# Patient Record
Sex: Male | Born: 1965 | Race: White | Hispanic: No | Marital: Single | Smoking: Never smoker
Health system: Southern US, Community
[De-identification: ages and names within clinical notes are randomized; demographics above are authoritative.]

## PROBLEM LIST (undated history)

## (undated) DIAGNOSIS — E119 Type 2 diabetes mellitus without complications: Secondary | ICD-10-CM

## (undated) DIAGNOSIS — I10 Essential (primary) hypertension: Secondary | ICD-10-CM

---

## 2015-09-18 DIAGNOSIS — E119 Type 2 diabetes mellitus without complications: Secondary | ICD-10-CM | POA: Diagnosis not present

## 2015-09-18 DIAGNOSIS — Z79899 Other long term (current) drug therapy: Secondary | ICD-10-CM | POA: Diagnosis not present

## 2015-09-18 DIAGNOSIS — I10 Essential (primary) hypertension: Secondary | ICD-10-CM | POA: Diagnosis not present

## 2015-09-25 DIAGNOSIS — E119 Type 2 diabetes mellitus without complications: Secondary | ICD-10-CM | POA: Diagnosis not present

## 2015-09-25 DIAGNOSIS — I10 Essential (primary) hypertension: Secondary | ICD-10-CM | POA: Diagnosis not present

## 2015-10-12 DIAGNOSIS — S7011XA Contusion of right thigh, initial encounter: Secondary | ICD-10-CM | POA: Diagnosis not present

## 2016-03-13 DIAGNOSIS — H52222 Regular astigmatism, left eye: Secondary | ICD-10-CM | POA: Diagnosis not present

## 2016-03-13 DIAGNOSIS — H5203 Hypermetropia, bilateral: Secondary | ICD-10-CM | POA: Diagnosis not present

## 2016-03-13 DIAGNOSIS — H524 Presbyopia: Secondary | ICD-10-CM | POA: Diagnosis not present

## 2016-04-03 DIAGNOSIS — E119 Type 2 diabetes mellitus without complications: Secondary | ICD-10-CM | POA: Diagnosis not present

## 2016-04-26 DIAGNOSIS — E1139 Type 2 diabetes mellitus with other diabetic ophthalmic complication: Secondary | ICD-10-CM | POA: Diagnosis not present

## 2016-04-26 DIAGNOSIS — Z6839 Body mass index (BMI) 39.0-39.9, adult: Secondary | ICD-10-CM | POA: Diagnosis not present

## 2016-04-26 DIAGNOSIS — I10 Essential (primary) hypertension: Secondary | ICD-10-CM | POA: Diagnosis not present

## 2016-08-20 DIAGNOSIS — Z79899 Other long term (current) drug therapy: Secondary | ICD-10-CM | POA: Diagnosis not present

## 2016-08-20 DIAGNOSIS — E119 Type 2 diabetes mellitus without complications: Secondary | ICD-10-CM | POA: Diagnosis not present

## 2016-08-27 DIAGNOSIS — E1139 Type 2 diabetes mellitus with other diabetic ophthalmic complication: Secondary | ICD-10-CM | POA: Diagnosis not present

## 2016-08-27 DIAGNOSIS — I10 Essential (primary) hypertension: Secondary | ICD-10-CM | POA: Diagnosis not present

## 2016-10-18 ENCOUNTER — Encounter (HOSPITAL_COMMUNITY): Payer: Self-pay | Admitting: *Deleted

## 2016-10-18 ENCOUNTER — Emergency Department (HOSPITAL_COMMUNITY): Payer: BLUE CROSS/BLUE SHIELD

## 2016-10-18 ENCOUNTER — Emergency Department (HOSPITAL_COMMUNITY)
Admission: EM | Admit: 2016-10-18 | Discharge: 2016-10-18 | Disposition: A | Discharge status: Home / Self Care | Payer: BLUE CROSS/BLUE SHIELD | Attending: Emergency Medicine | Admitting: Emergency Medicine

## 2016-10-18 DIAGNOSIS — Z79899 Other long term (current) drug therapy: Secondary | ICD-10-CM | POA: Diagnosis not present

## 2016-10-18 DIAGNOSIS — Z7982 Long term (current) use of aspirin: Secondary | ICD-10-CM | POA: Insufficient documentation

## 2016-10-18 DIAGNOSIS — Y990 Civilian activity done for income or pay: Secondary | ICD-10-CM | POA: Diagnosis not present

## 2016-10-18 DIAGNOSIS — E119 Type 2 diabetes mellitus without complications: Secondary | ICD-10-CM | POA: Diagnosis not present

## 2016-10-18 DIAGNOSIS — S0993XA Unspecified injury of face, initial encounter: Secondary | ICD-10-CM | POA: Diagnosis not present

## 2016-10-18 DIAGNOSIS — Y9289 Other specified places as the place of occurrence of the external cause: Secondary | ICD-10-CM | POA: Insufficient documentation

## 2016-10-18 DIAGNOSIS — I10 Essential (primary) hypertension: Secondary | ICD-10-CM | POA: Insufficient documentation

## 2016-10-18 DIAGNOSIS — S0592XA Unspecified injury of left eye and orbit, initial encounter: Secondary | ICD-10-CM | POA: Diagnosis present

## 2016-10-18 DIAGNOSIS — H578 Other specified disorders of eye and adnexa: Secondary | ICD-10-CM | POA: Diagnosis not present

## 2016-10-18 DIAGNOSIS — S0522XA Ocular laceration and rupture with prolapse or loss of intraocular tissue, left eye, initial encounter: Secondary | ICD-10-CM | POA: Diagnosis not present

## 2016-10-18 DIAGNOSIS — H538 Other visual disturbances: Secondary | ICD-10-CM | POA: Insufficient documentation

## 2016-10-18 DIAGNOSIS — Y939 Activity, unspecified: Secondary | ICD-10-CM | POA: Insufficient documentation

## 2016-10-18 DIAGNOSIS — S0990XA Unspecified injury of head, initial encounter: Secondary | ICD-10-CM | POA: Diagnosis not present

## 2016-10-18 DIAGNOSIS — T7411XA Adult physical abuse, confirmed, initial encounter: Secondary | ICD-10-CM | POA: Diagnosis not present

## 2016-10-18 DIAGNOSIS — S0532XA Ocular laceration without prolapse or loss of intraocular tissue, left eye, initial encounter: Secondary | ICD-10-CM

## 2016-10-18 DIAGNOSIS — H1132 Conjunctival hemorrhage, left eye: Secondary | ICD-10-CM

## 2016-10-18 HISTORY — DX: Type 2 diabetes mellitus without complications: E11.9

## 2016-10-18 HISTORY — DX: Essential (primary) hypertension: I10

## 2016-10-18 MED ORDER — FLUORESCEIN SODIUM 0.6 MG OP STRP
1.0000 | ORAL_STRIP | Freq: Once | OPHTHALMIC | Status: AC
  Administered 2016-10-18: 1 via OPHTHALMIC
  Filled 2016-10-18: qty 1

## 2016-10-18 MED ORDER — TETRACAINE HCL 0.5 % OP SOLN
1.0000 [drp] | Freq: Once | OPHTHALMIC | Status: AC
  Administered 2016-10-18: 1 [drp] via OPHTHALMIC
  Filled 2016-10-18: qty 4

## 2016-10-18 MED ORDER — ERYTHROMYCIN 5 MG/GM OP OINT
TOPICAL_OINTMENT | Freq: Once | OPHTHALMIC | Status: AC
  Administered 2016-10-18: 20:00:00 via OPHTHALMIC
  Filled 2016-10-18: qty 3.5
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
## 2016-10-18 NOTE — Discharge Instructions (Signed)
Avoid rubbing your eye. You may continue using cool compresses or an ice pack to help minimize swelling.  Apply a small ribbon of the antbiotic ointment in your left eye three times daily for the next week.  Call Dr. Charise Killianotter for a recheck of your injury early next week.

## 2016-10-21 DIAGNOSIS — S0532XA Ocular laceration without prolapse or loss of intraocular tissue, left eye, initial encounter: Secondary | ICD-10-CM | POA: Diagnosis not present

## 2016-10-21 DIAGNOSIS — H1132 Conjunctival hemorrhage, left eye: Secondary | ICD-10-CM | POA: Diagnosis not present

## 2016-10-25 DIAGNOSIS — S0532XD Ocular laceration without prolapse or loss of intraocular tissue, left eye, subsequent encounter: Secondary | ICD-10-CM | POA: Diagnosis not present

## 2016-10-25 DIAGNOSIS — H1132 Conjunctival hemorrhage, left eye: Secondary | ICD-10-CM | POA: Diagnosis not present

## 2016-11-01 DIAGNOSIS — H1132 Conjunctival hemorrhage, left eye: Secondary | ICD-10-CM | POA: Diagnosis not present

## 2016-11-01 DIAGNOSIS — S0532XD Ocular laceration without prolapse or loss of intraocular tissue, left eye, subsequent encounter: Secondary | ICD-10-CM | POA: Diagnosis not present

## 2016-11-27 DIAGNOSIS — Z79899 Other long term (current) drug therapy: Secondary | ICD-10-CM | POA: Diagnosis not present

## 2016-11-27 DIAGNOSIS — I10 Essential (primary) hypertension: Secondary | ICD-10-CM | POA: Diagnosis not present

## 2016-11-27 DIAGNOSIS — E11319 Type 2 diabetes mellitus with unspecified diabetic retinopathy without macular edema: Secondary | ICD-10-CM | POA: Diagnosis not present

## 2016-12-04 DIAGNOSIS — E785 Hyperlipidemia, unspecified: Secondary | ICD-10-CM | POA: Diagnosis not present

## 2016-12-04 DIAGNOSIS — I10 Essential (primary) hypertension: Secondary | ICD-10-CM | POA: Diagnosis not present

## 2016-12-04 DIAGNOSIS — E119 Type 2 diabetes mellitus without complications: Secondary | ICD-10-CM | POA: Diagnosis not present

## 2016-12-04 DIAGNOSIS — Z23 Encounter for immunization: Secondary | ICD-10-CM | POA: Diagnosis not present

## 2017-01-28 DIAGNOSIS — H53041 Amblyopia suspect, right eye: Secondary | ICD-10-CM | POA: Diagnosis not present

## 2017-01-28 DIAGNOSIS — E119 Type 2 diabetes mellitus without complications: Secondary | ICD-10-CM | POA: Diagnosis not present

## 2017-05-06 DIAGNOSIS — E11319 Type 2 diabetes mellitus with unspecified diabetic retinopathy without macular edema: Secondary | ICD-10-CM | POA: Diagnosis not present

## 2017-05-13 DIAGNOSIS — Z6839 Body mass index (BMI) 39.0-39.9, adult: Secondary | ICD-10-CM | POA: Diagnosis not present

## 2017-05-13 DIAGNOSIS — E1129 Type 2 diabetes mellitus with other diabetic kidney complication: Secondary | ICD-10-CM | POA: Diagnosis not present

## 2017-09-04 DIAGNOSIS — E1129 Type 2 diabetes mellitus with other diabetic kidney complication: Secondary | ICD-10-CM | POA: Diagnosis not present

## 2018-04-06 DIAGNOSIS — Z79899 Other long term (current) drug therapy: Secondary | ICD-10-CM | POA: Diagnosis not present

## 2018-04-06 DIAGNOSIS — I10 Essential (primary) hypertension: Secondary | ICD-10-CM | POA: Diagnosis not present

## 2018-04-06 DIAGNOSIS — E785 Hyperlipidemia, unspecified: Secondary | ICD-10-CM | POA: Diagnosis not present

## 2018-04-06 DIAGNOSIS — E1129 Type 2 diabetes mellitus with other diabetic kidney complication: Secondary | ICD-10-CM | POA: Diagnosis not present

## 2018-04-13 DIAGNOSIS — E1169 Type 2 diabetes mellitus with other specified complication: Secondary | ICD-10-CM | POA: Diagnosis not present

## 2018-04-13 DIAGNOSIS — E785 Hyperlipidemia, unspecified: Secondary | ICD-10-CM | POA: Diagnosis not present

## 2018-04-13 DIAGNOSIS — Z23 Encounter for immunization: Secondary | ICD-10-CM | POA: Diagnosis not present

## 2018-04-13 DIAGNOSIS — I10 Essential (primary) hypertension: Secondary | ICD-10-CM | POA: Diagnosis not present

## 2018-04-24 DIAGNOSIS — H53041 Amblyopia suspect, right eye: Secondary | ICD-10-CM | POA: Diagnosis not present

## 2018-04-24 DIAGNOSIS — E119 Type 2 diabetes mellitus without complications: Secondary | ICD-10-CM | POA: Diagnosis not present

## 2018-04-24 DIAGNOSIS — H2513 Age-related nuclear cataract, bilateral: Secondary | ICD-10-CM | POA: Diagnosis not present

## 2018-04-24 DIAGNOSIS — H40053 Ocular hypertension, bilateral: Secondary | ICD-10-CM | POA: Diagnosis not present
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
## 2018-08-05 DIAGNOSIS — Z79899 Other long term (current) drug therapy: Secondary | ICD-10-CM | POA: Diagnosis not present

## 2018-08-05 DIAGNOSIS — E785 Hyperlipidemia, unspecified: Secondary | ICD-10-CM | POA: Diagnosis not present

## 2018-08-05 DIAGNOSIS — E1159 Type 2 diabetes mellitus with other circulatory complications: Secondary | ICD-10-CM | POA: Diagnosis not present

## 2018-08-05 DIAGNOSIS — I10 Essential (primary) hypertension: Secondary | ICD-10-CM | POA: Diagnosis not present

## 2018-08-12 DIAGNOSIS — E1159 Type 2 diabetes mellitus with other circulatory complications: Secondary | ICD-10-CM | POA: Diagnosis not present

## 2018-12-09 DIAGNOSIS — E1159 Type 2 diabetes mellitus with other circulatory complications: Secondary | ICD-10-CM | POA: Diagnosis not present

## 2018-12-09 DIAGNOSIS — I10 Essential (primary) hypertension: Secondary | ICD-10-CM | POA: Diagnosis not present

## 2018-12-09 DIAGNOSIS — Z79899 Other long term (current) drug therapy: Secondary | ICD-10-CM | POA: Diagnosis not present

## 2018-12-16 DIAGNOSIS — I10 Essential (primary) hypertension: Secondary | ICD-10-CM | POA: Diagnosis not present

## 2018-12-16 DIAGNOSIS — E1129 Type 2 diabetes mellitus with other diabetic kidney complication: Secondary | ICD-10-CM | POA: Diagnosis not present

## 2019-04-12 DIAGNOSIS — Z79899 Other long term (current) drug therapy: Secondary | ICD-10-CM | POA: Diagnosis not present

## 2019-04-12 DIAGNOSIS — I10 Essential (primary) hypertension: Secondary | ICD-10-CM | POA: Diagnosis not present

## 2019-04-12 DIAGNOSIS — E1129 Type 2 diabetes mellitus with other diabetic kidney complication: Secondary | ICD-10-CM | POA: Diagnosis not present

## 2019-04-19 DIAGNOSIS — E1122 Type 2 diabetes mellitus with diabetic chronic kidney disease: Secondary | ICD-10-CM | POA: Diagnosis not present

## 2019-04-19 DIAGNOSIS — U071 COVID-19: Secondary | ICD-10-CM | POA: Diagnosis not present

## 2019-09-28 DIAGNOSIS — E1129 Type 2 diabetes mellitus with other diabetic kidney complication: Secondary | ICD-10-CM | POA: Diagnosis not present

## 2019-10-08 DIAGNOSIS — Z6841 Body Mass Index (BMI) 40.0 and over, adult: Secondary | ICD-10-CM | POA: Diagnosis not present

## 2019-10-08 DIAGNOSIS — I10 Essential (primary) hypertension: Secondary | ICD-10-CM | POA: Diagnosis not present

## 2019-10-08 DIAGNOSIS — E1129 Type 2 diabetes mellitus with other diabetic kidney complication: Secondary | ICD-10-CM | POA: Diagnosis not present

## 2020-02-08 DIAGNOSIS — E1129 Type 2 diabetes mellitus with other diabetic kidney complication: Secondary | ICD-10-CM | POA: Diagnosis not present

## 2020-02-15 DIAGNOSIS — Z23 Encounter for immunization: Secondary | ICD-10-CM | POA: Diagnosis not present

## 2020-02-15 DIAGNOSIS — E1122 Type 2 diabetes mellitus with diabetic chronic kidney disease: Secondary | ICD-10-CM | POA: Diagnosis not present

## 2020-02-15 DIAGNOSIS — I10 Essential (primary) hypertension: Secondary | ICD-10-CM | POA: Diagnosis not present

## 2020-02-15 DIAGNOSIS — R7309 Other abnormal glucose: Secondary | ICD-10-CM | POA: Diagnosis not present

## 2020-06-07 DIAGNOSIS — E11319 Type 2 diabetes mellitus with unspecified diabetic retinopathy without macular edema: Secondary | ICD-10-CM | POA: Diagnosis not present

## 2020-06-14 DIAGNOSIS — R7309 Other abnormal glucose: Secondary | ICD-10-CM | POA: Diagnosis not present

## 2020-06-14 DIAGNOSIS — I10 Essential (primary) hypertension: Secondary | ICD-10-CM | POA: Diagnosis not present

## 2020-06-14 DIAGNOSIS — E1122 Type 2 diabetes mellitus with diabetic chronic kidney disease: Secondary | ICD-10-CM | POA: Diagnosis not present

## 2020-09-09 DIAGNOSIS — H6690 Otitis media, unspecified, unspecified ear: Secondary | ICD-10-CM | POA: Diagnosis not present

## 2020-09-09 DIAGNOSIS — H6090 Unspecified otitis externa, unspecified ear: Secondary | ICD-10-CM | POA: Diagnosis not present

## 2020-09-13 DIAGNOSIS — H66003 Acute suppurative otitis media without spontaneous rupture of ear drum, bilateral: Secondary | ICD-10-CM | POA: Diagnosis not present

## 2020-09-13 DIAGNOSIS — E119 Type 2 diabetes mellitus without complications: Secondary | ICD-10-CM | POA: Diagnosis not present

## 2020-09-21 DIAGNOSIS — H7291 Unspecified perforation of tympanic membrane, right ear: Secondary | ICD-10-CM | POA: Diagnosis not present

## 2020-09-21 DIAGNOSIS — H908 Mixed conductive and sensorineural hearing loss, unspecified: Secondary | ICD-10-CM | POA: Diagnosis not present

## 2020-09-21 DIAGNOSIS — H6692 Otitis media, unspecified, left ear: Secondary | ICD-10-CM | POA: Diagnosis not present

## 2020-09-21 DIAGNOSIS — E119 Type 2 diabetes mellitus without complications: Secondary | ICD-10-CM | POA: Diagnosis not present

## 2020-10-09 DIAGNOSIS — E1129 Type 2 diabetes mellitus with other diabetic kidney complication: Secondary | ICD-10-CM | POA: Diagnosis not present

## 2020-10-09 DIAGNOSIS — I10 Essential (primary) hypertension: Secondary | ICD-10-CM | POA: Diagnosis not present

## 2020-10-09 DIAGNOSIS — Z125 Encounter for screening for malignant neoplasm of prostate: Secondary | ICD-10-CM | POA: Diagnosis not present

## 2020-10-09 DIAGNOSIS — Z79899 Other long term (current) drug therapy: Secondary | ICD-10-CM | POA: Diagnosis not present

## 2020-10-16 DIAGNOSIS — E1122 Type 2 diabetes mellitus with diabetic chronic kidney disease: Secondary | ICD-10-CM | POA: Diagnosis not present

## 2020-10-16 DIAGNOSIS — I10 Essential (primary) hypertension: Secondary | ICD-10-CM | POA: Diagnosis not present

## 2020-10-16 DIAGNOSIS — R7309 Other abnormal glucose: Secondary | ICD-10-CM | POA: Diagnosis not present

## 2020-10-16 DIAGNOSIS — M109 Gout, unspecified: Secondary | ICD-10-CM | POA: Diagnosis not present

## 2020-11-13 DIAGNOSIS — H903 Sensorineural hearing loss, bilateral: Secondary | ICD-10-CM | POA: Diagnosis not present

## 2020-11-13 DIAGNOSIS — H6993 Unspecified Eustachian tube disorder, bilateral: Secondary | ICD-10-CM | POA: Diagnosis not present

## 2021-01-12 DIAGNOSIS — H6993 Unspecified Eustachian tube disorder, bilateral: Secondary | ICD-10-CM | POA: Diagnosis not present

## 2021-01-12 DIAGNOSIS — H6692 Otitis media, unspecified, left ear: Secondary | ICD-10-CM | POA: Diagnosis not present

## 2021-02-14 DIAGNOSIS — Z79899 Other long term (current) drug therapy: Secondary | ICD-10-CM | POA: Diagnosis not present

## 2021-02-14 DIAGNOSIS — M109 Gout, unspecified: Secondary | ICD-10-CM | POA: Diagnosis not present

## 2021-02-14 DIAGNOSIS — E1129 Type 2 diabetes mellitus with other diabetic kidney complication: Secondary | ICD-10-CM | POA: Diagnosis not present

## 2021-02-21 DIAGNOSIS — I10 Essential (primary) hypertension: Secondary | ICD-10-CM | POA: Diagnosis not present

## 2021-02-21 DIAGNOSIS — M109 Gout, unspecified: Secondary | ICD-10-CM | POA: Diagnosis not present

## 2021-02-21 DIAGNOSIS — E1122 Type 2 diabetes mellitus with diabetic chronic kidney disease: Secondary | ICD-10-CM | POA: Diagnosis not present

## 2021-02-21 DIAGNOSIS — R7309 Other abnormal glucose: Secondary | ICD-10-CM | POA: Diagnosis not present

## 2021-02-21 DIAGNOSIS — Z23 Encounter for immunization: Secondary | ICD-10-CM | POA: Diagnosis not present

## 2021-06-13 DIAGNOSIS — E1159 Type 2 diabetes mellitus with other circulatory complications: Secondary | ICD-10-CM | POA: Diagnosis not present

## 2021-06-13 DIAGNOSIS — I10 Essential (primary) hypertension: Secondary | ICD-10-CM | POA: Diagnosis not present

## 2021-06-13 DIAGNOSIS — M109 Gout, unspecified: Secondary | ICD-10-CM | POA: Diagnosis not present

## 2021-06-13 DIAGNOSIS — Z79899 Other long term (current) drug therapy: Secondary | ICD-10-CM | POA: Diagnosis not present

## 2021-06-20 DIAGNOSIS — R7309 Other abnormal glucose: Secondary | ICD-10-CM | POA: Diagnosis not present

## 2021-06-20 DIAGNOSIS — E785 Hyperlipidemia, unspecified: Secondary | ICD-10-CM | POA: Diagnosis not present

## 2021-06-20 DIAGNOSIS — I10 Essential (primary) hypertension: Secondary | ICD-10-CM | POA: Diagnosis not present

## 2021-06-20 DIAGNOSIS — M109 Gout, unspecified: Secondary | ICD-10-CM | POA: Diagnosis not present

## 2021-10-12 DIAGNOSIS — M109 Gout, unspecified: Secondary | ICD-10-CM | POA: Diagnosis not present

## 2021-10-12 DIAGNOSIS — E118 Type 2 diabetes mellitus with unspecified complications: Secondary | ICD-10-CM | POA: Diagnosis not present

## 2021-10-12 DIAGNOSIS — Z79899 Other long term (current) drug therapy: Secondary | ICD-10-CM | POA: Diagnosis not present

## 2021-10-12 DIAGNOSIS — I10 Essential (primary) hypertension: Secondary | ICD-10-CM | POA: Diagnosis not present

## 2021-10-19 DIAGNOSIS — I10 Essential (primary) hypertension: Secondary | ICD-10-CM | POA: Diagnosis not present

## 2021-10-19 DIAGNOSIS — E1122 Type 2 diabetes mellitus with diabetic chronic kidney disease: Secondary | ICD-10-CM | POA: Diagnosis not present

## 2021-10-19 DIAGNOSIS — R7309 Other abnormal glucose: Secondary | ICD-10-CM | POA: Diagnosis not present

## 2021-10-23 DIAGNOSIS — N3000 Acute cystitis without hematuria: Secondary | ICD-10-CM | POA: Diagnosis not present

## 2021-10-29 ENCOUNTER — Other Ambulatory Visit: Payer: Self-pay | Admitting: Internal Medicine

## 2021-10-29 ENCOUNTER — Other Ambulatory Visit (HOSPITAL_COMMUNITY): Payer: Self-pay | Admitting: Internal Medicine

## 2021-10-29 DIAGNOSIS — N390 Urinary tract infection, site not specified: Secondary | ICD-10-CM

## 2021-11-06 ENCOUNTER — Ambulatory Visit (HOSPITAL_COMMUNITY)
Admission: RE | Admit: 2021-11-06 | Discharge: 2021-11-06 | Disposition: A | Discharge status: Home / Self Care | Payer: BC Managed Care – PPO | Source: Ambulatory Visit | Attending: Internal Medicine | Admitting: Internal Medicine

## 2021-11-06 DIAGNOSIS — N390 Urinary tract infection, site not specified: Secondary | ICD-10-CM | POA: Diagnosis not present

## 2022-01-22 DIAGNOSIS — Z79899 Other long term (current) drug therapy: Secondary | ICD-10-CM | POA: Diagnosis not present

## 2022-01-22 DIAGNOSIS — I10 Essential (primary) hypertension: Secondary | ICD-10-CM | POA: Diagnosis not present

## 2022-01-22 DIAGNOSIS — E1129 Type 2 diabetes mellitus with other diabetic kidney complication: Secondary | ICD-10-CM | POA: Diagnosis not present

## 2022-01-22 DIAGNOSIS — E785 Hyperlipidemia, unspecified: Secondary | ICD-10-CM | POA: Diagnosis not present

## 2022-01-29 DIAGNOSIS — Z23 Encounter for immunization: Secondary | ICD-10-CM | POA: Diagnosis not present

## 2022-01-29 DIAGNOSIS — R7309 Other abnormal glucose: Secondary | ICD-10-CM | POA: Diagnosis not present

## 2022-01-29 DIAGNOSIS — I10 Essential (primary) hypertension: Secondary | ICD-10-CM | POA: Diagnosis not present

## 2022-01-29 DIAGNOSIS — E1122 Type 2 diabetes mellitus with diabetic chronic kidney disease: Secondary | ICD-10-CM | POA: Diagnosis not present

## 2022-05-29 DIAGNOSIS — E1129 Type 2 diabetes mellitus with other diabetic kidney complication: Secondary | ICD-10-CM | POA: Diagnosis not present

## 2022-06-12 DIAGNOSIS — E1122 Type 2 diabetes mellitus with diabetic chronic kidney disease: Secondary | ICD-10-CM | POA: Diagnosis not present

## 2022-06-12 DIAGNOSIS — I10 Essential (primary) hypertension: Secondary | ICD-10-CM | POA: Diagnosis not present

## 2022-06-12 DIAGNOSIS — R7309 Other abnormal glucose: Secondary | ICD-10-CM | POA: Diagnosis not present

## 2022-09-02 DIAGNOSIS — I10 Essential (primary) hypertension: Secondary | ICD-10-CM | POA: Diagnosis not present

## 2022-09-02 DIAGNOSIS — Z79899 Other long term (current) drug therapy: Secondary | ICD-10-CM | POA: Diagnosis not present

## 2022-09-02 DIAGNOSIS — E1129 Type 2 diabetes mellitus with other diabetic kidney complication: Secondary | ICD-10-CM | POA: Diagnosis not present

## 2022-09-02 DIAGNOSIS — E785 Hyperlipidemia, unspecified: Secondary | ICD-10-CM | POA: Diagnosis not present

## 2022-09-02 DIAGNOSIS — M1000 Idiopathic gout, unspecified site: Secondary | ICD-10-CM | POA: Diagnosis not present

## 2022-09-02 DIAGNOSIS — Z125 Encounter for screening for malignant neoplasm of prostate: Secondary | ICD-10-CM | POA: Diagnosis not present

## 2022-09-09 DIAGNOSIS — E1122 Type 2 diabetes mellitus with diabetic chronic kidney disease: Secondary | ICD-10-CM | POA: Diagnosis not present

## 2022-09-09 DIAGNOSIS — I10 Essential (primary) hypertension: Secondary | ICD-10-CM | POA: Diagnosis not present

## 2022-09-09 DIAGNOSIS — E785 Hyperlipidemia, unspecified: Secondary | ICD-10-CM | POA: Diagnosis not present

## 2022-12-09 DIAGNOSIS — E1129 Type 2 diabetes mellitus with other diabetic kidney complication: Secondary | ICD-10-CM | POA: Diagnosis not present

## 2022-12-16 DIAGNOSIS — I10 Essential (primary) hypertension: Secondary | ICD-10-CM | POA: Diagnosis not present

## 2022-12-16 DIAGNOSIS — Z23 Encounter for immunization: Secondary | ICD-10-CM | POA: Diagnosis not present

## 2022-12-16 DIAGNOSIS — E1122 Type 2 diabetes mellitus with diabetic chronic kidney disease: Secondary | ICD-10-CM | POA: Diagnosis not present

## 2023-03-25 DIAGNOSIS — E1129 Type 2 diabetes mellitus with other diabetic kidney complication: Secondary | ICD-10-CM | POA: Diagnosis not present

## 2023-04-02 DIAGNOSIS — I10 Essential (primary) hypertension: Secondary | ICD-10-CM | POA: Diagnosis not present

## 2023-04-02 DIAGNOSIS — E1122 Type 2 diabetes mellitus with diabetic chronic kidney disease: Secondary | ICD-10-CM | POA: Diagnosis not present

## 2023-06-25 DIAGNOSIS — E1129 Type 2 diabetes mellitus with other diabetic kidney complication: Secondary | ICD-10-CM | POA: Diagnosis not present

## 2023-07-02 DIAGNOSIS — E1129 Type 2 diabetes mellitus with other diabetic kidney complication: Secondary | ICD-10-CM | POA: Diagnosis not present

## 2023-10-01 DIAGNOSIS — I10 Essential (primary) hypertension: Secondary | ICD-10-CM | POA: Diagnosis not present

## 2023-10-01 DIAGNOSIS — E1129 Type 2 diabetes mellitus with other diabetic kidney complication: Secondary | ICD-10-CM | POA: Diagnosis not present

## 2023-10-01 DIAGNOSIS — Z79899 Other long term (current) drug therapy: Secondary | ICD-10-CM | POA: Diagnosis not present

## 2023-10-01 DIAGNOSIS — E785 Hyperlipidemia, unspecified: Secondary | ICD-10-CM | POA: Diagnosis not present

## 2023-10-09 DIAGNOSIS — E1129 Type 2 diabetes mellitus with other diabetic kidney complication: Secondary | ICD-10-CM | POA: Diagnosis not present

## 2023-10-09 DIAGNOSIS — E785 Hyperlipidemia, unspecified: Secondary | ICD-10-CM | POA: Diagnosis not present

## 2023-10-09 DIAGNOSIS — M1000 Idiopathic gout, unspecified site: Secondary | ICD-10-CM | POA: Diagnosis not present

## 2023-10-09 DIAGNOSIS — I10 Essential (primary) hypertension: Secondary | ICD-10-CM | POA: Diagnosis not present

## 2024-02-04 DIAGNOSIS — E1129 Type 2 diabetes mellitus with other diabetic kidney complication: Secondary | ICD-10-CM | POA: Diagnosis not present

## 2024-02-11 DIAGNOSIS — I10 Essential (primary) hypertension: Secondary | ICD-10-CM | POA: Diagnosis not present

## 2024-02-11 DIAGNOSIS — E1129 Type 2 diabetes mellitus with other diabetic kidney complication: Secondary | ICD-10-CM | POA: Diagnosis not present

## 2024-02-11 DIAGNOSIS — Z23 Encounter for immunization: Secondary | ICD-10-CM | POA: Diagnosis not present
